# Patient Record
Sex: Female | Born: 1950 | Hispanic: No | Smoking: Never smoker
Health system: Southern US, Community
[De-identification: ages and names within clinical notes are randomized; demographics above are authoritative.]

## PROBLEM LIST (undated history)

## (undated) DIAGNOSIS — I10 Essential (primary) hypertension: Secondary | ICD-10-CM

---

## 2004-09-24 ENCOUNTER — Ambulatory Visit: Payer: Self-pay | Admitting: Ophthalmology

## 2004-10-01 ENCOUNTER — Ambulatory Visit: Payer: Self-pay | Admitting: Ophthalmology

## 2004-11-27 ENCOUNTER — Ambulatory Visit: Payer: Self-pay | Admitting: Obstetrics and Gynecology

## 2005-11-28 ENCOUNTER — Ambulatory Visit: Payer: Self-pay | Admitting: Obstetrics and Gynecology

## 2006-12-08 ENCOUNTER — Ambulatory Visit: Payer: Self-pay | Admitting: Obstetrics and Gynecology

## 2007-12-10 ENCOUNTER — Ambulatory Visit: Payer: Self-pay | Admitting: Obstetrics and Gynecology

## 2008-12-15 ENCOUNTER — Ambulatory Visit: Payer: Self-pay | Admitting: Obstetrics and Gynecology

## 2009-09-20 ENCOUNTER — Ambulatory Visit: Payer: Self-pay | Admitting: Ophthalmology

## 2009-09-26 ENCOUNTER — Ambulatory Visit: Payer: Self-pay | Admitting: Cardiovascular Disease

## 2009-10-02 ENCOUNTER — Ambulatory Visit: Payer: Self-pay | Admitting: Ophthalmology

## 2009-11-29 ENCOUNTER — Ambulatory Visit: Payer: Self-pay | Admitting: Ophthalmology

## 2009-12-19 ENCOUNTER — Ambulatory Visit: Payer: Self-pay | Admitting: Obstetrics and Gynecology

## 2011-01-30 ENCOUNTER — Ambulatory Visit: Payer: Self-pay | Admitting: Obstetrics and Gynecology

## 2011-10-25 ENCOUNTER — Ambulatory Visit: Payer: Self-pay | Admitting: Gastroenterology

## 2013-01-18 ENCOUNTER — Ambulatory Visit: Payer: Self-pay | Admitting: Obstetrics and Gynecology

## 2014-01-24 ENCOUNTER — Ambulatory Visit: Payer: Self-pay | Admitting: Obstetrics and Gynecology

## 2014-06-30 DIAGNOSIS — F32A Depression, unspecified: Secondary | ICD-10-CM | POA: Insufficient documentation

## 2015-08-11 ENCOUNTER — Other Ambulatory Visit: Payer: Self-pay | Admitting: Internal Medicine

## 2015-08-11 DIAGNOSIS — M6281 Muscle weakness (generalized): Secondary | ICD-10-CM

## 2015-08-31 ENCOUNTER — Ambulatory Visit: Payer: 59

## 2017-09-19 ENCOUNTER — Other Ambulatory Visit: Payer: Self-pay | Admitting: Otolaryngology

## 2017-09-19 DIAGNOSIS — R42 Dizziness and giddiness: Secondary | ICD-10-CM

## 2017-09-19 DIAGNOSIS — R202 Paresthesia of skin: Secondary | ICD-10-CM

## 2017-09-19 DIAGNOSIS — R200 Anesthesia of skin: Secondary | ICD-10-CM

## 2017-09-29 ENCOUNTER — Ambulatory Visit: Payer: 59

## 2017-09-30 ENCOUNTER — Ambulatory Visit: Payer: 59

## 2017-10-01 ENCOUNTER — Ambulatory Visit
Admission: RE | Admit: 2017-10-01 | Discharge: 2017-10-01 | Disposition: A | Discharge status: Home / Self Care | Payer: Medicare Other | Source: Ambulatory Visit | Attending: Otolaryngology | Admitting: Otolaryngology

## 2017-10-01 ENCOUNTER — Encounter: Payer: Self-pay | Admitting: Radiology

## 2017-10-01 DIAGNOSIS — R200 Anesthesia of skin: Secondary | ICD-10-CM | POA: Diagnosis not present

## 2017-10-01 DIAGNOSIS — R202 Paresthesia of skin: Secondary | ICD-10-CM | POA: Insufficient documentation

## 2017-10-01 DIAGNOSIS — R42 Dizziness and giddiness: Secondary | ICD-10-CM | POA: Diagnosis not present

## 2017-10-01 MED ORDER — GADOBENATE DIMEGLUMINE 529 MG/ML IV SOLN
15.0000 mL | Freq: Once | INTRAVENOUS | Status: AC | PRN
  Administered 2017-10-01: 10 mL via INTRAVENOUS

## 2019-08-29 ENCOUNTER — Ambulatory Visit: Payer: Medicare Other | Attending: Internal Medicine

## 2019-08-29 DIAGNOSIS — Z23 Encounter for immunization: Secondary | ICD-10-CM | POA: Insufficient documentation

## 2019-08-29 NOTE — Progress Notes (Signed)
   Covid-19 Vaccination Clinic  Name:  Pamela Blevins    MRN: [id number] DOB: 1951/05/18  08/29/2019  Pamela Blevins was observed post Covid-19 immunization for 15 minutes without incidence. She was provided with Vaccine Information Sheet and instruction to access the V-Safe system.   Pamela Blevins was instructed to call 911 with any severe reactions post vaccine: Marland Kitchen Difficulty breathing  . Swelling of your face and throat  . A fast heartbeat  . A bad rash all over your body  . Dizziness and weakness    Immunizations Administered    Name Date Dose VIS Date Route   Pfizer COVID-19 Vaccine 08/29/2019  9:35 AM 0.3 mL 06/25/2019 Intramuscular   Manufacturer: [employer], [employer]   Lot: [id number]   NDC: [address]-9

## 2019-09-22 ENCOUNTER — Ambulatory Visit: Payer: Medicare Other | Attending: Internal Medicine

## 2019-09-22 DIAGNOSIS — Z23 Encounter for immunization: Secondary | ICD-10-CM

## 2019-09-22 NOTE — Progress Notes (Signed)
   Covid-19 Vaccination Clinic  Name:  Pamela Blevins    MRN: [id number] DOB: 10/11/1950  09/22/2019  [name] was observed post Covid-19 immunization for 15 minutes without incident. She was provided with Vaccine Information Sheet and instruction to access the V-Safe system.   [name] was instructed to call 911 with any severe reactions post vaccine: Marland Kitchen Difficulty breathing  . Swelling of face and throat  . A fast heartbeat  . A bad rash all over body  . Dizziness and weakness   Immunizations Administered    Name Date Dose VIS Date Route   Pfizer COVID-19 Vaccine 09/22/2019  2:36 PM 0.3 mL 06/25/2019 Intramuscular   Manufacturer: [employer], [employer]   Lot: [id number]   NDC: [address]-8

## 2020-11-21 ENCOUNTER — Other Ambulatory Visit: Payer: Self-pay | Admitting: Family Medicine

## 2020-11-21 DIAGNOSIS — M5416 Radiculopathy, lumbar region: Secondary | ICD-10-CM

## 2020-11-30 ENCOUNTER — Ambulatory Visit
Admission: RE | Admit: 2020-11-30 | Discharge: 2020-11-30 | Disposition: A | Discharge status: Home / Self Care | Payer: Medicare Other | Source: Ambulatory Visit | Attending: Family Medicine | Admitting: Family Medicine

## 2020-11-30 ENCOUNTER — Other Ambulatory Visit: Payer: Self-pay

## 2020-11-30 DIAGNOSIS — M5416 Radiculopathy, lumbar region: Secondary | ICD-10-CM | POA: Insufficient documentation

## 2021-04-06 ENCOUNTER — Other Ambulatory Visit: Payer: Self-pay | Admitting: Sports Medicine

## 2021-04-06 ENCOUNTER — Other Ambulatory Visit (HOSPITAL_BASED_OUTPATIENT_CLINIC_OR_DEPARTMENT_OTHER): Payer: Self-pay | Admitting: Sports Medicine

## 2021-04-06 DIAGNOSIS — S83411S Sprain of medial collateral ligament of right knee, sequela: Secondary | ICD-10-CM

## 2021-04-06 DIAGNOSIS — M7041 Prepatellar bursitis, right knee: Secondary | ICD-10-CM

## 2021-04-06 DIAGNOSIS — W19XXXD Unspecified fall, subsequent encounter: Secondary | ICD-10-CM

## 2021-04-06 DIAGNOSIS — M2391 Unspecified internal derangement of right knee: Secondary | ICD-10-CM

## 2021-04-06 DIAGNOSIS — M25561 Pain in right knee: Secondary | ICD-10-CM

## 2021-04-25 ENCOUNTER — Ambulatory Visit
Admission: RE | Admit: 2021-04-25 | Discharge: 2021-04-25 | Disposition: A | Discharge status: Home / Self Care | Payer: Medicare Other | Source: Ambulatory Visit | Attending: Sports Medicine | Admitting: Sports Medicine

## 2021-04-25 DIAGNOSIS — M2391 Unspecified internal derangement of right knee: Secondary | ICD-10-CM | POA: Insufficient documentation

## 2021-04-25 DIAGNOSIS — S83411S Sprain of medial collateral ligament of right knee, sequela: Secondary | ICD-10-CM | POA: Insufficient documentation

## 2021-04-25 DIAGNOSIS — M67461 Ganglion, right knee: Secondary | ICD-10-CM | POA: Diagnosis not present

## 2021-04-25 DIAGNOSIS — M7041 Prepatellar bursitis, right knee: Secondary | ICD-10-CM | POA: Diagnosis not present

## 2021-04-25 DIAGNOSIS — M25561 Pain in right knee: Secondary | ICD-10-CM | POA: Diagnosis present

## 2021-04-25 DIAGNOSIS — W19XXXS Unspecified fall, sequela: Secondary | ICD-10-CM | POA: Diagnosis not present

## 2021-04-25 DIAGNOSIS — M25461 Effusion, right knee: Secondary | ICD-10-CM | POA: Insufficient documentation

## 2021-04-25 DIAGNOSIS — W19XXXD Unspecified fall, subsequent encounter: Secondary | ICD-10-CM

## 2022-04-25 DIAGNOSIS — M79644 Pain in right finger(s): Secondary | ICD-10-CM | POA: Insufficient documentation

## 2022-05-07 ENCOUNTER — Ambulatory Visit: Payer: Self-pay | Admitting: Podiatry

## 2022-05-08 ENCOUNTER — Ambulatory Visit: Payer: Medicare Other | Admitting: Dermatology

## 2022-05-08 ENCOUNTER — Ambulatory Visit (INDEPENDENT_AMBULATORY_CARE_PROVIDER_SITE_OTHER): Payer: Medicare Other

## 2022-05-08 ENCOUNTER — Ambulatory Visit (INDEPENDENT_AMBULATORY_CARE_PROVIDER_SITE_OTHER): Payer: Medicare Other | Admitting: Podiatry

## 2022-05-08 ENCOUNTER — Encounter: Payer: Self-pay | Admitting: Podiatry

## 2022-05-08 DIAGNOSIS — M7742 Metatarsalgia, left foot: Secondary | ICD-10-CM

## 2022-05-08 DIAGNOSIS — M7741 Metatarsalgia, right foot: Secondary | ICD-10-CM

## 2022-05-08 DIAGNOSIS — J309 Allergic rhinitis, unspecified: Secondary | ICD-10-CM | POA: Insufficient documentation

## 2022-05-08 DIAGNOSIS — G609 Hereditary and idiopathic neuropathy, unspecified: Secondary | ICD-10-CM | POA: Diagnosis not present

## 2022-05-08 DIAGNOSIS — M810 Age-related osteoporosis without current pathological fracture: Secondary | ICD-10-CM | POA: Insufficient documentation

## 2022-05-08 DIAGNOSIS — E785 Hyperlipidemia, unspecified: Secondary | ICD-10-CM | POA: Insufficient documentation

## 2022-05-08 DIAGNOSIS — E042 Nontoxic multinodular goiter: Secondary | ICD-10-CM | POA: Insufficient documentation

## 2022-05-08 DIAGNOSIS — M1990 Unspecified osteoarthritis, unspecified site: Secondary | ICD-10-CM | POA: Insufficient documentation

## 2022-05-08 DIAGNOSIS — I10 Essential (primary) hypertension: Secondary | ICD-10-CM | POA: Insufficient documentation

## 2022-05-08 DIAGNOSIS — G5600 Carpal tunnel syndrome, unspecified upper limb: Secondary | ICD-10-CM | POA: Insufficient documentation

## 2022-05-09 LAB — B12 AND FOLATE PANEL
Folate: >20 ng/mL (ref >3.0)
Vitamin B-12: >2000 pg/mL — ABNORMAL HIGH (ref 232–1245)

## 2022-05-13 NOTE — Progress Notes (Signed)
  Subjective:  Patient ID: Pamela Blevins, female    DOB: 1951-04-04,  MRN: [id number]  Chief Complaint  Patient presents with   Neuroma     NP - (goes by "Pamela Blevins") Pain in the pads of both feet, Left is  worse. History of osteoporosis and arthritis. Recent hand surgery    71 y.o. female presents with the above complaint. History confirmed with patient.   Objective:  Physical Exam: warm, good capillary refill, no trophic changes or ulcerative lesions, normal DP and PT pulses, and some decrease sensation in the toes, there is nonspecific tenderness that I cannot localize to any 1 joint or interspace.   Radiographs: Multiple views x-ray of both feet: no fracture, dislocation, swelling or degenerative changes noted Assessment:   1. Metatarsalgia of both feet   2. Idiopathic peripheral neuropathy      Plan:  Patient was evaluated and treated and all questions answered.  Discussed etiology and treatment options of metatarsalgia in general as well as likely idiopathic peripheral neuropathy.  I recommended checking a B12 and folate level these were normal and she takes a B12 supplement.  Discussed supportive shoe gear.  Return as needed if it worsens  No follow-ups on file.

## 2022-11-14 ENCOUNTER — Emergency Department
Admission: EM | Admit: 2022-11-14 | Discharge: 2022-11-14 | Disposition: A | Discharge status: Home / Self Care | Payer: Medicare Other | Attending: Emergency Medicine | Admitting: Emergency Medicine

## 2022-11-14 ENCOUNTER — Encounter: Payer: Self-pay | Admitting: Emergency Medicine

## 2022-11-14 ENCOUNTER — Other Ambulatory Visit: Payer: Self-pay

## 2022-11-14 DIAGNOSIS — I10 Essential (primary) hypertension: Secondary | ICD-10-CM | POA: Insufficient documentation

## 2022-11-14 DIAGNOSIS — R42 Dizziness and giddiness: Secondary | ICD-10-CM | POA: Diagnosis present

## 2022-11-14 HISTORY — DX: Essential (primary) hypertension: I10

## 2022-11-14 LAB — URINALYSIS, ROUTINE W REFLEX MICROSCOPIC
Bilirubin Urine: NEGATIVE
Glucose, UA: NEGATIVE mg/dL
Hgb urine dipstick: NEGATIVE
Ketones, ur: NEGATIVE mg/dL
Leukocytes,Ua: NEGATIVE
Nitrite: NEGATIVE
Protein, ur: NEGATIVE mg/dL
Specific Gravity, Urine: 1.016 (ref 1.005–1.030)
pH: 7 (ref 5.0–8.0)

## 2022-11-14 LAB — BASIC METABOLIC PANEL
Anion gap: 7 (ref 5–15)
BUN: 20 mg/dL (ref 8–23)
CO2: 26 mmol/L (ref 22–32)
Calcium: 9.3 mg/dL (ref 8.9–10.3)
Chloride: 106 mmol/L (ref 98–111)
Creatinine, Ser: 0.69 mg/dL (ref 0.44–1.00)
GFR, Estimated: >60 mL/min (ref >60)
Glucose, Bld: 138 mg/dL — ABNORMAL HIGH (ref 70–99)
Potassium: 3.7 mmol/L (ref 3.5–5.1)
Sodium: 139 mmol/L (ref 135–145)

## 2022-11-14 LAB — CBC
HCT: 38.2 % (ref 36.0–46.0)
Hemoglobin: 12.7 g/dL (ref 12.0–15.0)
MCH: 31.5 pg (ref 26.0–34.0)
MCHC: 33.2 g/dL (ref 30.0–36.0)
MCV: 94.8 fL (ref 80.0–100.0)
Platelets: 130 10*3/uL — ABNORMAL LOW (ref 150–400)
RBC: 4.03 MIL/uL (ref 3.87–5.11)
RDW: 11.4 % — ABNORMAL LOW (ref 11.5–15.5)
WBC: 4.7 10*3/uL (ref 4.0–10.5)
nRBC: 0 % (ref 0.0–0.2)

## 2022-11-14 MED ORDER — LACTATED RINGERS IV BOLUS
1000.0000 mL | Freq: Once | INTRAVENOUS | Status: AC
  Administered 2022-11-14: 1000 mL via INTRAVENOUS

## 2022-11-14 MED ORDER — ONDANSETRON HCL 4 MG/2ML IJ SOLN
4.0000 mg | Freq: Once | INTRAMUSCULAR | Status: AC
  Administered 2022-11-14: 4 mg via INTRAVENOUS
  Filled 2022-11-14: qty 2

## 2022-11-14 NOTE — ED Triage Notes (Signed)
Pt to ED via [facility] from church for syncopal episode this morning. Pt states that she had been feeling dizzy this morning. Pt states that she was at church and started having nausea and felt like she needed to go to the bathroom. Pt reports that when she got up to go to the bathroom she fell. Bystanders state that pt was going in and out of consciousness. EMS reports pt did not have any LOC with them. Pt denies hitting her head. Pt did just return from [place] 1 week ago.

## 2022-11-14 NOTE — Discharge Instructions (Signed)
You likely had a vasovagal episode.  Please make sure you are staying hydrated and get up slowly.  If you have any new symptoms that are concerning to you please return to the emergency department.

## 2022-11-14 NOTE — ED Provider Notes (Addendum)
[facility] Provider Note    Event Date/Time   First MD Initiated Contact with Patient 11/14/22 1033     (approximate)   History   Loss of Consciousness   HPI  Pamela Blevins is a 72 y.o. female  with pmh HTN, HLD, who presents after an episode of dizziness.  Patient was at church today she was kneeling at the pew for about 5 minutes when she suddenly became very lightheaded says she felt warm dizzy like she was going to pass out felt very nauseous and had diarrhea with incontinence and vomited.  She did not actually syncopized.  Her friends brought her to the bathroom and she vomited additional 3 times.  She says she had a similar feeling when she gave blood several years ago.  After returning home she felt very dizzy and checked her blood pressure and it was low.  She denies any associated vision change numbness tingling weakness.  Denies chest pain palpitations or dyspnea.  She did return from [place] last week but denies chest pain palpitations or dyspnea.  She denies any recent illnesses fevers chills.  No vomiting or diarrhea prior to this episode.  Denies abdominal pain.  Says currently she feels slightly dizzy but significantly improved.  Her friend who is with her said that when she saw her after she started to be symptomatic she looks very pale and clammy.  Says she looks much better now.     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 --      Excl. in GC? --     Most recent vital signs: Vitals:   11/14/22 1130 11/14/22 1200  BP: 120/77 133/61  Pulse: (!) 55 (!) 54  Resp: 16 18  Temp:    SpO2: 100% 100%     General: Awake, no distress.  CV:  Good peripheral perfusion.  Resp:  Normal effort.  Abd:  No distention.  Neuro:             Awake, Alert, Oriented x 3  Other:     ED Results / Procedures / Treatments  Labs (all labs ordered are listed, but only abnormal results are displayed) Labs Reviewed  BASIC METABOLIC PANEL - Abnormal; Notable for the following components:      Result Value   Glucose, Bld 138 (*)  All other components within normal limits  CBC - Abnormal; Notable for the following components:   RDW 11.4 (*)    Platelets 130 (*)    All other components within normal limits  URINALYSIS, ROUTINE W REFLEX MICROSCOPIC - Abnormal; Notable for the following components:   Color, Urine YELLOW (*)    APPearance CLOUDY (*)    Bacteria, UA MANY (*)    All other components within normal limits  CBG MONITORING, ED     EKG  EKG interpretation performed by myself: prolonged PR interval, nml axis, nml intervals, no acute ischemic changes    RADIOLOGY    PROCEDURES:  Critical Care performed: No  Procedures  The patient is on the cardiac monitor to evaluate for evidence of arrhythmia and/or significant heart rate changes.   MEDICATIONS ORDERED IN ED: Medications  lactated ringers bolus 1,000 mL (1,000 mLs Intravenous New Bag/Given 11/14/22 1119)  ondansetron (ZOFRAN) injection 4 mg (4 mg Intravenous Given 11/14/22 1119)     IMPRESSION / MDM / ASSESSMENT AND PLAN / ED  COURSE  I reviewed the triage vital signs and the nursing notes.                              Patient's presentation is most consistent with acute complicated illness / injury requiring diagnostic workup.  Differential diagnosis includes, but is not limited to, vasovagal episode, gastroenteritis, arrhythmia, orthostatic hypotension, anemia  The patient is a 72 year old female who is relatively healthy who presents after an episode of dizziness.  She was kneeling at the pew at church when she acutely became dizzy which she describes as lightheaded warm and nauseated vomited and had an episode of diarrhea on herself.  She then had 3 additional episodes of emesis in the bathroom at church.  Did not fully syncopized.  She denies associated vision change numbness tingling weakness.  She did recently travel from [place] and returned about a week ago.  She has no chest pain palpitations or dyspnea.  No fevers no GI symptoms prior to the episode of dizziness and no urinary symptoms.  Patient is bradycardic heart rate in the 50s BP okay.  The rest of her vitals are reassuring.  On exam she does look somewhat dry but the rest of her exam is reassuring abdominal exam is benign neurologic exam is nonfocal.  EKG is read as prolonged QT but I think that this is likely misinterpreted by the computer as the T waves are somewhat flattened.  Repeat EKG with normal QT interval, prolonged PR interval.  This episode to me sounds like a vasovagal episode especially with the significant prodrome.  Does not sound like vertigo.  Plan to give bolus of fluid Zofran and check basic labs including CBC and BMP.   CBC and BMP are reassuring.  Patient's urine does have 6-10 white cells with many bacteria but she is asymptomatic.  Think this is likely asymptomatic bacteriuria and will not treat at this time.  Patient feeling improved after bolus of fluid.  She is tolerating p.o.  Was able to ambulate without any dizziness or  presyncope.  This point I do think she is stable for discharge.  We discussed return precautions.      FINAL CLINICAL IMPRESSION(S) / ED DIAGNOSES   Final diagnoses:  Postural dizziness with presyncope     Rx / DC Orders   ED Discharge Orders     None  Note:  This document was prepared using Dragon voice recognition software and may include unintentional dictation errors.   Georga Hacking, MD 11/14/22 1245    Georga Hacking, MD 11/14/22 1341

## 2022-12-02 ENCOUNTER — Encounter: Payer: Self-pay | Admitting: Emergency Medicine

## 2022-12-02 ENCOUNTER — Emergency Department
Admission: EM | Admit: 2022-12-02 | Discharge: 2022-12-02 | Disposition: A | Discharge status: Home / Self Care | Payer: Medicare Other | Attending: Emergency Medicine | Admitting: Emergency Medicine

## 2022-12-02 DIAGNOSIS — Z203 Contact with and (suspected) exposure to rabies: Secondary | ICD-10-CM | POA: Insufficient documentation

## 2022-12-02 DIAGNOSIS — Z2914 Encounter for prophylactic rabies immune globin: Secondary | ICD-10-CM | POA: Insufficient documentation

## 2022-12-02 DIAGNOSIS — S61031A Puncture wound without foreign body of right thumb without damage to nail, initial encounter: Secondary | ICD-10-CM | POA: Insufficient documentation

## 2022-12-02 DIAGNOSIS — W5501XA Bitten by cat, initial encounter: Secondary | ICD-10-CM | POA: Diagnosis not present

## 2022-12-02 DIAGNOSIS — Z23 Encounter for immunization: Secondary | ICD-10-CM | POA: Diagnosis not present

## 2022-12-02 MED ORDER — RABIES VACCINE, PCEC IM SUSR
1.0000 mL | Freq: Once | INTRAMUSCULAR | Status: AC
  Administered 2022-12-02: 1 mL via INTRAMUSCULAR
  Filled 2022-12-02: qty 1

## 2022-12-02 MED ORDER — RABIES IMMUNE GLOBULIN 150 UNIT/ML IM INJ
20.0000 [IU]/kg | INJECTION | Freq: Once | INTRAMUSCULAR | Status: AC
  Administered 2022-12-02: 1125 [IU]
  Filled 2022-12-02: qty 8

## 2022-12-02 MED ORDER — AMOXICILLIN-POT CLAVULANATE 875-125 MG PO TABS: 1.0000 | ORAL_TABLET | Freq: Two times a day (BID) | ORAL | 0 refills | Status: AC

## 2022-12-02 MED ORDER — AMOXICILLIN-POT CLAVULANATE 875-125 MG PO TABS
1.0000 | ORAL_TABLET | Freq: Once | ORAL | Status: AC
  Administered 2022-12-02: 1 via ORAL
  Filled 2022-12-02: qty 1

## 2022-12-02 MED ORDER — TETANUS-DIPHTH-ACELL PERTUSSIS 5-2.5-18.5 LF-MCG/0.5 IM SUSY
0.5000 mL | PREFILLED_SYRINGE | Freq: Once | INTRAMUSCULAR | Status: AC
  Administered 2022-12-02: 0.5 mL via INTRAMUSCULAR
  Filled 2022-12-02: qty 0.5

## 2022-12-02 NOTE — ED Provider Notes (Signed)
[facility] Provider Note  Patient Contact: 8:36 PM (approximate)   History   Animal Bite   HPI  Pamela Blevins is a 72 y.o. female who presents the emergency department for evaluation of a cat bite to the hand.  Patient states that she was taking care of a feral cat that she has been feeding.  Over the last several days the cat has not been eating or drinking and visibly losing weight.  Patient reports that the cat had saliva or foaming around the mouth and was acting very atypical.  Patient tried to pick up the cat with a blanket to take it to the bat when it bit her.  Patient called her primary care who referred her to the emergency department.  Patient is unsure of her last tetanus and I cannot find record in the EMR however recent tetanus shot so we will administer tetanus today.  Patient has reported small amount of erythema and around the bite but no loss of range of motion to the digits.  No other injury or complaint at this time.     Physical Exam   Triage Vital Signs: ED Triage Vitals  Enc Vitals Group     BP 12/02/22 1919 (!) 149/80     Pulse Rate 12/02/22 1919 68     Resp 12/02/22 1919 18     Temp 12/02/22 1919 98.2 F (36.8 C)     Temp src --      SpO2 12/02/22 1919 100 %     Weight 12/02/22 1919 128 lb (58.1 kg)     Height 12/02/22 1919 5\' 2"  (1.575 m)     Head Circumference --      Peak Flow --      Pain Score 12/02/22 1921 0     Pain Loc --      Pain Edu? --      Excl. in GC? --     Most recent vital signs: Vitals:   12/02/22 1919  BP: (!) 149/80  Pulse: 68  Resp: 18  Temp: 98.2 F (36.8 C)  SpO2: 100%    General: Alert and in no acute distress.  Cardiovascular:  Good peripheral perfusion Respiratory: Normal respiratory effort without tachypnea or retractions. Lungs CTAB.  Musculoskeletal: Full range of motion to all extremities.  Visualization of the right hand reveals a puncture wound to the base of the thumb.  There is  some mild surrounding erythema.  No evidence of retained foreign body.  No ragged laceration.  Full range of motion is preserved to all digits of the hand and thumb. Neurologic:  No gross focal neurologic deficits are appreciated.  Skin:   No rash noted Other:   ED Results / Procedures / Treatments   Labs (all labs ordered are listed, but only abnormal results are displayed) Labs Reviewed - No data to display   EKG     RADIOLOGY    No results found.  PROCEDURES:  Critical Care performed: No  Procedures   MEDICATIONS ORDERED IN ED: Medications  rabies vaccine (RABAVERT) injection 1 mL (has no administration in time range)  rabies immune globulin (HYPERRAB/KEDRAB) injection 1,125 Units (has no administration in time range)  Tdap (BOOSTRIX) injection 0.5 mL (has no administration in time range)  amoxicillin-clavulanate (AUGMENTIN) 875-125 MG per tablet 1 tablet (has no administration in time range)     IMPRESSION / MDM / ASSESSMENT AND PLAN / ED COURSE  I reviewed the triage vital  signs and the nursing notes.                                 Differential diagnosis includes, but is not limited to, cat bite, need for rabies prophylaxis, retained foreign body, cat scratch   Patient's presentation is most consistent with acute presentation with potential threat to life or bodily function.   Patient's diagnosis is consistent with cat bite to the right hilum.  Patient presents emergency department after being bitten by a feral cat.  The feral cat has been having behavioral changes, not eating or drinking and is foaming at the mouth.  I am very concerned at this time the patient could have an encounter with a rabid animal.  As such we have administered the rabies prophylactic series of vaccine and immunoglobin.  Tetanus shot is updated.  She will be placed on antibiotics for the bite.  No indication for IV antibiotics currently.  Concerning signs and symptoms in regards to  infection are discussed with the patient.  She will follow-up in 3 days for her second rabies vaccine in either this department or an urgent care.  Patient should return to the ED for any signs of worsening infection.  Patient is agreeable with this plan at this time..  Patient is given ED precautions to return to the ED for any worsening or new symptoms.     FINAL CLINICAL IMPRESSION(S) / ED DIAGNOSES   Final diagnoses:  Cat bite, initial encounter  Need for post exposure prophylaxis for rabies     Rx / DC Orders   ED Discharge Orders          Ordered    amoxicillin-clavulanate (AUGMENTIN) 875-125 MG tablet  2 times daily        12/02/22 2155             Note:  This document was prepared using Dragon voice recognition software and may include unintentional dictation errors.   Lanette Hampshire 12/02/22 2158    Concha Se, MD 12/04/22 [phone]

## 2022-12-02 NOTE — ED Triage Notes (Signed)
Pt presents ambulatory to triage via POV with complaints of cat bite to the R thumb by a stray cat in her neighborhood this AM. Pt contacted her PCP who advised that she come here for antibiotics. Mild erythema to the base of her R thumb with a small puncture mark. A&Ox4 at this time. Denies fevers, chills, N/V/D, CP or SOB.

## 2022-12-02 NOTE — Discharge Instructions (Addendum)
Of a rabies vaccine in 3 days from now, 7 days from now and 14 days from now.  I recommend following up with the urgent care listed above.  You can schedule an appointment and it is a nurse visit.  You still may return to any other urgent care or may return to the emergency department for your next rabies vaccine.  Please take all of the antibiotic.  If you have increasing pain, swelling or drainage from the Bite you should return to the ED for evaluation for worsening infection.

## 2022-12-02 NOTE — ED Notes (Signed)
See triage note. Small puncture evident on palmar surface at base of right thumb. Cat is stray that lives in her back yard. Pt exhibits full RoM and describes mild pain at site.

## 2022-12-05 ENCOUNTER — Ambulatory Visit
Admission: RE | Admit: 2022-12-05 | Discharge: 2022-12-05 | Disposition: A | Discharge status: Home / Self Care | Payer: Medicare Other | Source: Ambulatory Visit | Attending: Internal Medicine | Admitting: Internal Medicine

## 2022-12-05 DIAGNOSIS — Z23 Encounter for immunization: Secondary | ICD-10-CM

## 2022-12-05 DIAGNOSIS — Z203 Contact with and (suspected) exposure to rabies: Secondary | ICD-10-CM

## 2022-12-05 MED ORDER — RABIES VACCINE, PCEC IM SUSR
1.0000 mL | Freq: Once | INTRAMUSCULAR | Status: AC
  Administered 2022-12-05: 1 mL via INTRAMUSCULAR

## 2022-12-05 NOTE — ED Triage Notes (Signed)
Patient presents to UC for day 3 dose. Initial doses given in the ED at 05/20.

## 2022-12-09 ENCOUNTER — Ambulatory Visit
Admission: EM | Admit: 2022-12-09 | Discharge: 2022-12-09 | Disposition: A | Discharge status: Home / Self Care | Payer: Medicare Other | Attending: Emergency Medicine | Admitting: Emergency Medicine

## 2022-12-09 DIAGNOSIS — Z23 Encounter for immunization: Secondary | ICD-10-CM | POA: Diagnosis not present

## 2022-12-09 DIAGNOSIS — Z203 Contact with and (suspected) exposure to rabies: Secondary | ICD-10-CM

## 2022-12-09 MED ORDER — RABIES VACCINE, PCEC IM SUSR: 1.0000 mL | Freq: Once | INTRAMUSCULAR | Status: DC

## 2022-12-09 MED ORDER — RABIES VACCINE, PCEC IM SUSR
1.0000 mL | Freq: Once | INTRAMUSCULAR | Status: AC
  Administered 2022-12-09: 1 mL via INTRAMUSCULAR

## 2022-12-09 NOTE — ED Notes (Signed)
Tolerated rabies vaccine well. Follow-up visit for final rabies vaccine in series reviewed with patient. No further questions asked.

## 2022-12-09 NOTE — ED Triage Notes (Addendum)
Patient presents to Urgent Care for 3rd rabies vaccination in series. Exposed via cat bite 5/20.   Denies any other complaints.

## 2022-12-16 ENCOUNTER — Ambulatory Visit
Admission: EM | Admit: 2022-12-16 | Discharge: 2022-12-16 | Disposition: A | Discharge status: Home / Self Care | Payer: Medicare Other | Attending: Emergency Medicine | Admitting: Emergency Medicine

## 2022-12-16 DIAGNOSIS — Z203 Contact with and (suspected) exposure to rabies: Secondary | ICD-10-CM | POA: Diagnosis not present

## 2022-12-16 DIAGNOSIS — Z23 Encounter for immunization: Secondary | ICD-10-CM | POA: Diagnosis not present

## 2022-12-16 MED ORDER — RABIES VACCINE, PCEC IM SUSR
1.0000 mL | Freq: Once | INTRAMUSCULAR | Status: AC
  Administered 2022-12-16: 1 mL via INTRAMUSCULAR

## 2022-12-16 NOTE — ED Triage Notes (Signed)
Patient presents to UC for day 14 rabies injection. Tolerated previous doses well.

## 2023-07-30 IMAGING — MR MR KNEE*R* W/O CM
6 series · 40 of 40 positions shown · non-contrast
Comparison: None.

CLINICAL DATA: Patient complains of right medial knee pain due to a
fall injury 3 months ago. No history of surgery reported.

EXAM:
MRI OF THE RIGHT KNEE WITHOUT CONTRAST
TECHNIQUE: Multiplanar, multisequence MR imaging of the knee was performed. No
intravenous contrast was administered.

[Series 8: T2 fat-sat · axial · right · 4.0mm · 0.50mm/px · z∈[-73,+52]mm · 5 of 26 slices shown (1 of 3)]
[im 1/26]
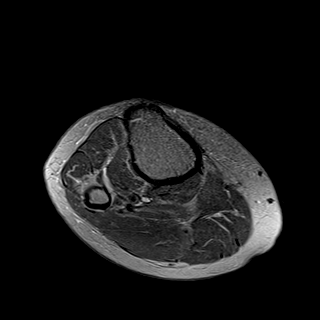
[im 7/26]
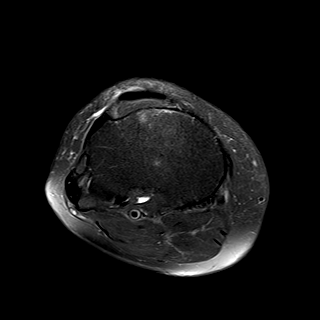
[im 13/26]
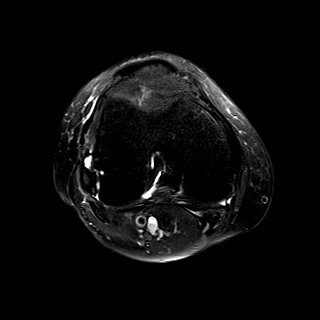
[im 19/26]
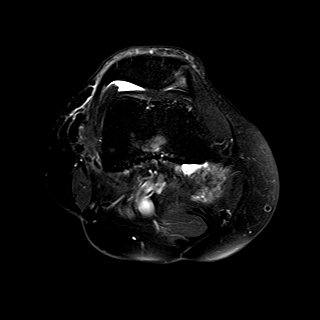
[im 26/26]
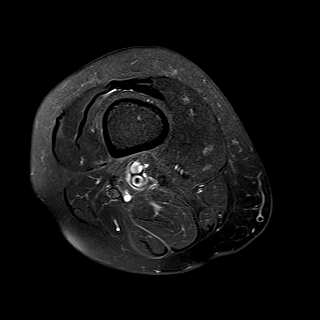

[Series 9: T2 fat-sat · coronal · right · 4.0mm · 0.59mm/px · 7 of 30 slices shown (2 of 3)]
[im 1/30]
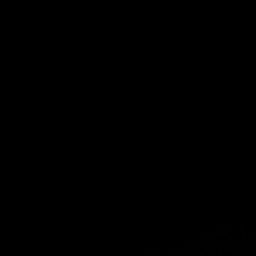
[im 5/30]
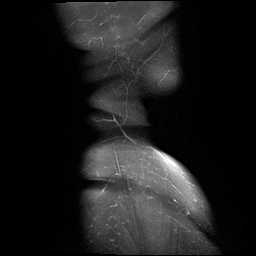
[im 10/30]
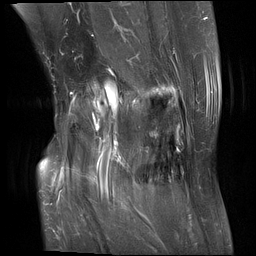
[im 15/30]
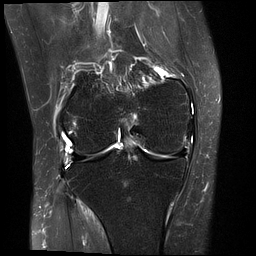
[im 20/30]
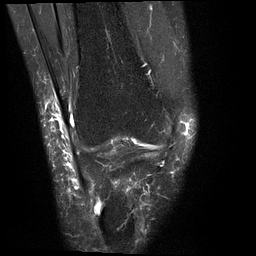
[im 25/30]
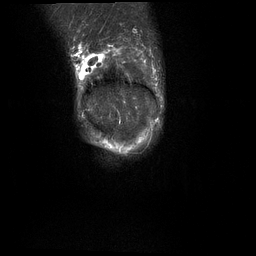
[im 30/30]
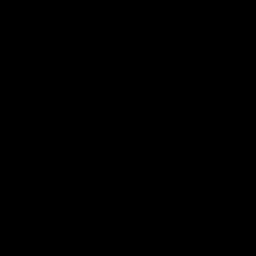

[Series 10: T1 · coronal · right · 4.0mm · 0.59mm/px · 7 of 30 slices shown]
[im 1/30]
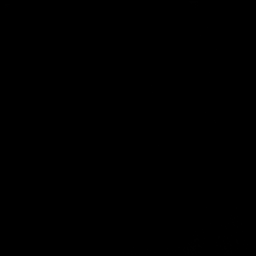
[im 5/30]
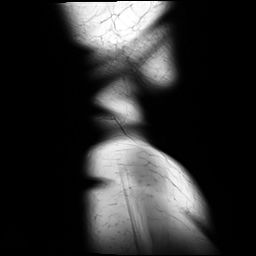
[im 10/30]
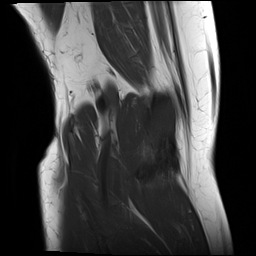
[im 15/30]
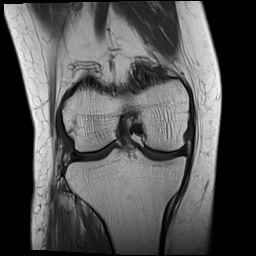
[im 20/30]
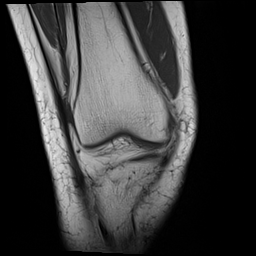
[im 25/30]
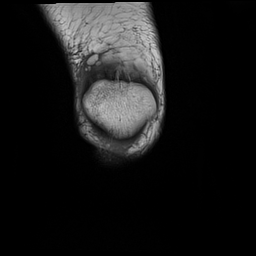
[im 30/30]
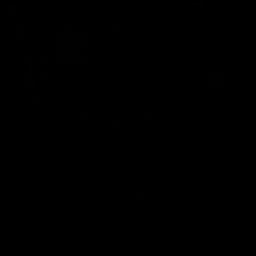

[Series 11: PD fat-sat · coronal · right · 4.0mm · 0.59mm/px · 7 of 29 slices shown (1 of 2)]
[im 1/29]
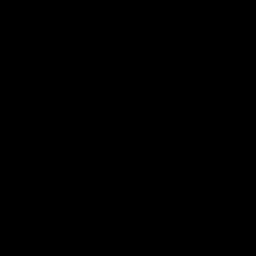
[im 5/29]
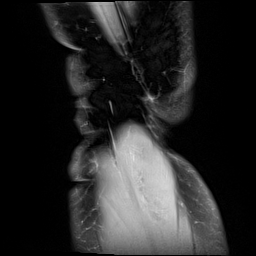
[im 10/29]
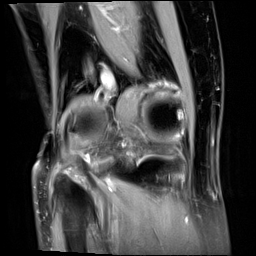
[im 15/29]
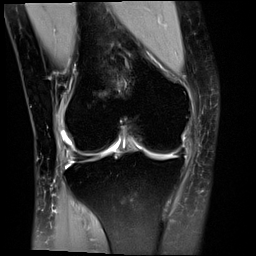
[im 19/29]
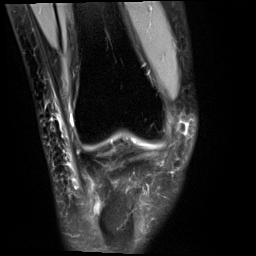
[im 24/29]
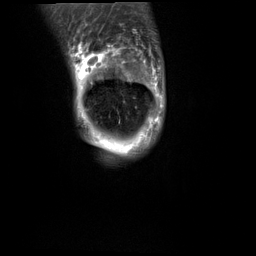
[im 29/29]
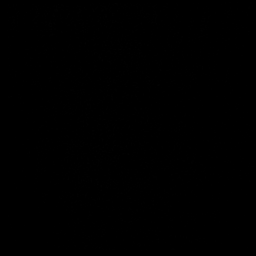

[Series 12: PD fat-sat · sagittal · right · 3.0mm · 0.59mm/px · 7 of 30 slices shown (2 of 2)]
[im 1/30]
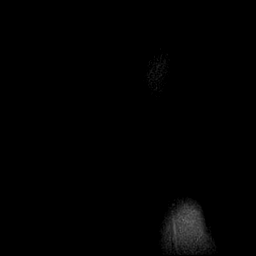
[im 5/30]
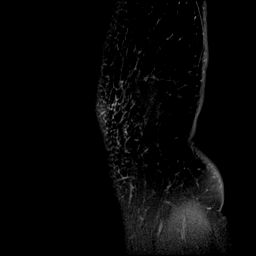
[im 10/30]
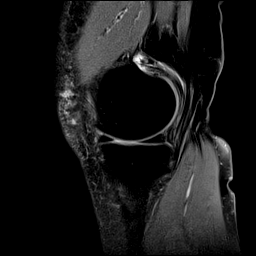
[im 15/30]
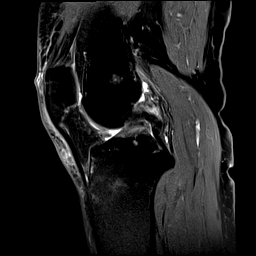
[im 20/30]
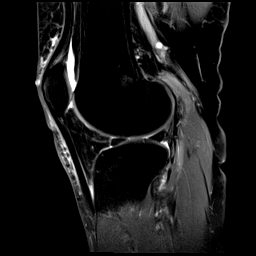
[im 25/30]
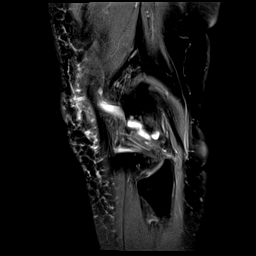
[im 30/30]
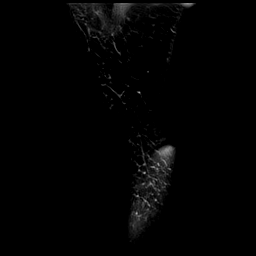

[Series 13: T2 fat-sat · sagittal · right · 3.0mm · 0.59mm/px · 7 of 30 slices shown (3 of 3)]
[im 1/30]
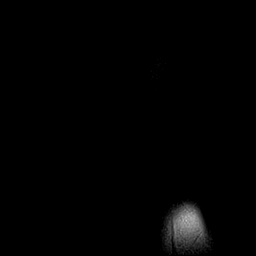
[im 5/30]
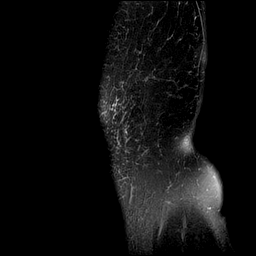
[im 10/30]
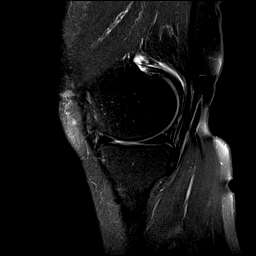
[im 15/30]
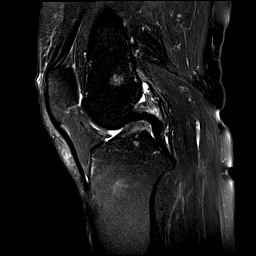
[im 20/30]
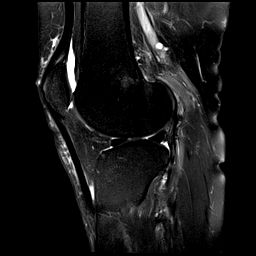
[im 25/30]
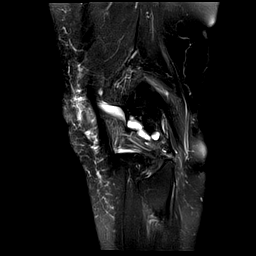
[im 30/30]
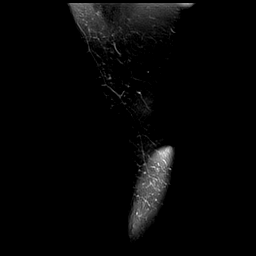

[40 of 40 positions shown; findings below may reference images not displayed]

FINDINGS: MENISCI

Medial meniscus: Intermediate signal within the medial meniscal body
and posterior horn without discrete tear.

Lateral meniscus: Intact.

LIGAMENTS

Cruciates: Intact ACL and PCL.

Collaterals: Intact MCL. The fibular collateral ligament is
thickened with mild periligamentous edema along its proximal aspect.
The distal IT band and biceps femoris attachments are intact.

CARTILAGE

Patellofemoral:  No chondral defect.

Medial:  No chondral defect.

Lateral: No chondral defect.

Joint:  Trace joint effusion.  Fat pads within normal limits.

Popliteal Fossa: No Baker's cyst.  Intact popliteus tendon.

Extensor Mechanism: Intact quadriceps tendon and patellofemoral
tendon.

Bones: No focal marow signal abnormality. No fracture or
dislocation.

Other: Small ganglion cysts adjacent to the origin of the medial
head of the gastrocnemius. Minimal prepatellar subcutaneous edema.
Normal muscle bulk and signal intensity.
IMPRESSION: 1. Grade 1 sprain of the fibular collateral ligament.
2. Medial meniscal degeneration without discrete tear.
3. Trace joint effusion.
4. Small ganglion cysts adjacent to the origin of the medial head of
the gastrocnemius.
